# Patient Record
Sex: Female | Born: 1985 | Race: Black or African American | Hispanic: No | Marital: Married | Smoking: Never smoker
Health system: Southern US, Community
[De-identification: ages and names within clinical notes are randomized; demographics above are authoritative.]

## PROBLEM LIST (undated history)

## (undated) HISTORY — PX: CHOLECYSTECTOMY: SHX55

---
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
## 2005-04-20 ENCOUNTER — Emergency Department (HOSPITAL_COMMUNITY): Admission: EM | Admit: 2005-04-20 | Discharge: 2005-04-20 | Payer: Self-pay | Admitting: Emergency Medicine

## 2011-05-11 ENCOUNTER — Emergency Department (HOSPITAL_COMMUNITY)
Admission: EM | Admit: 2011-05-11 | Discharge: 2011-05-12 | Disposition: A | Discharge status: Home / Self Care | Payer: Medicaid Other | Attending: Emergency Medicine | Admitting: Emergency Medicine

## 2011-05-11 ENCOUNTER — Encounter (HOSPITAL_COMMUNITY): Payer: Self-pay | Admitting: Emergency Medicine

## 2011-05-11 DIAGNOSIS — IMO0002 Reserved for concepts with insufficient information to code with codable children: Secondary | ICD-10-CM | POA: Insufficient documentation

## 2011-05-11 DIAGNOSIS — M79609 Pain in unspecified limb: Secondary | ICD-10-CM | POA: Insufficient documentation

## 2011-05-11 DIAGNOSIS — M795 Residual foreign body in soft tissue: Secondary | ICD-10-CM | POA: Insufficient documentation

## 2011-05-11 MED ORDER — LIDOCAINE-EPINEPHRINE 2 %-1:100000 IJ SOLN: 1.7000 mL | Freq: Once | INTRAMUSCULAR | Status: DC

## 2011-05-11 NOTE — ED Notes (Addendum)
Right hand abscess, started last two weeks. Noted about penials eraser head on top of the right forearm,  Stated it hurt

## 2011-05-11 NOTE — ED Notes (Signed)
PT. REPORTS ABSCESS AT RIGHT WRIST WITH DRAINAGE FOR 2 WEEKS.

## 2011-05-11 NOTE — ED Notes (Signed)
Md at bedside

## 2011-05-11 NOTE — Discharge Instructions (Signed)
Foreign Body When the skin is cut or punctured and some object is left in the tissue under the skin, that object is called a "foreign body". A foreign body could be a wood splinter, a thorn, a sliver of metal, a shard of glass, a cactus needle or the tip of a pencil. In most instances, your caregiver will recommend that the foreign body be removed. If it is not removed, infection, abscess formation, an allergic reaction, chronic pain and disability can occur over time. Sometimes, foreign bodies (particularly very small ones) can be difficult to locate. Your caregiver may recommend x-rays or ultrasound imaging to help find them. If removal is not successful, there may be a need to see a surgeon who might suggest further exploration in the operating room. Occasionally, tiny bits of metallic foreign material (such as shrapnel) are not removed, if it is felt that there would be no harm in leaving them untouched. HOME CARE INSTRUCTIONS  Rest the injured area and keep it elevated until all the pain and swelling are gone.   You will need a tetanus vaccination if you have not had one in the last 5 years.   Return to this facility, see your caregiver or follow-up as instructed in 2 days.  SEEK IMMEDIATE MEDICAL CARE IF:   You develop increasing redness or swelling of the skin near the wound.   You develop drainage of pus from the wound.   You have persistent pain or loss of motion.   You have red streaks extending above or below the wound location.  MAKE SURE YOU:   Understand these instructions.   Will watch your condition.   Will get help right away if you are not doing well or get worse.  Document Released: 02/17/2005 Document Revised: 02/06/2011 Document Reviewed: 01/19/2009 [facility] Patient Information 2012 [place], [place]. Please negative on with your primary care provider to have the suture removed in 1 day.  Keep this ear clean and dry.  He can wash once daily with soap and water and apply  a thin layer of antibiotic ointment

## 2011-05-11 NOTE — ED Provider Notes (Signed)
History     CSN: [id number]  Arrival date & time 05/11/11  2226   First MD Initiated Contact with Patient 05/11/11 2302      Chief Complaint  Patient presents with  . Abscess    (Consider location/radiation/quality/duration/timing/severity/associated sxs/prior treatment) HPI Comments: Patient has a dermal [employer] for body piercing in her right forearm that has become displaced and is partially erupted through the skin  Patient is a 26 y.o. female presenting with abscess. The history is provided by the patient.  Abscess  This is a new problem. The current episode started less than one week ago. The problem occurs continuously. Pertinent negatives include no fever.    History reviewed. No pertinent past medical history.  History reviewed. No pertinent past surgical history.  History reviewed. No pertinent family history.  History  Substance Use Topics  . Smoking status: Never Smoker   . Smokeless tobacco: Not on file  . Alcohol Use: No    OB History    Grav Para Term Preterm Abortions TAB SAB Ect Mult Living                  Review of Systems  Constitutional: Negative for fever.  Musculoskeletal: Negative for joint swelling.  Skin: Positive for wound.    Allergies  Citrus  Home Medications   Current Outpatient Rx  Name Route Sig Dispense Refill  . PRENATAL 27-0.8 MG PO TABS Oral Take 1 tablet by mouth daily.      BP 133/85  Pulse 71  Temp(Src) 98 F (36.7 C) (Oral)  Resp 14  SpO2 100%  LMP 04/18/2011  Physical Exam  Constitutional: She appears well-developed and well-nourished.  HENT:  Head: Normocephalic.  Cardiovascular: Normal rate.   Pulmonary/Chest: She is in respiratory distress.  Musculoskeletal: Normal range of motion.  Skin:       Foreign body partially erupted.  The skin on the distal right forearm.  No surrounding erythema.  The object is painful to the surrounding tissue when moved    ED Course  FOREIGN BODY REMOVAL Date/Time:  05/11/2011 11:50 PM Performed by: Arman Filter Authorized by: Arman Filter Consent: Verbal consent obtained. Risks and benefits: risks, benefits and alternatives were discussed Consent given by: patient Patient identity confirmed: verbally with patient Time out: Immediately prior to procedure a "time out" was called to verify the correct patient, procedure, equipment, support staff and site/side marked as required. Body area: skin Anesthesia: local infiltration Local anesthetic: lidocaine 1% with epinephrine Anesthetic total: 1 ml Patient sedated: no Patient restrained: no Localization method: visualized Removal mechanism: scalpel Dressing: antibiotic ointment and dressing applied Tendon involvement: none Complexity: simple Objects recovered: dermal Post-procedure assessment: foreign body removed Patient tolerance: Patient tolerated the procedure well with no immediate complications. LACERATION REPAIR Performed by: Arman Filter Authorized by: Arman Filter   (including critical care time)  Labs Reviewed - No data to display No results found.   1. Foreign body (FB) in soft tissue     LACERATION REPAIR Performed by: Arman Filter Authorized by: Arman Filter Consent: Verbal consent obtained. Risks and benefits: risks, benefits and alternatives were discussed Consent given by: patient Patient identity confirmed: provided demographic data Prepped and Draped in normal sterile fashion Wound explored  Laceration Location: incision for FB removal   Laceration Length: .25cm  No Foreign Bodies seen or palpated  Anesthesia: local infiltration  Local anesthetic: lidocaine1% epinephrine  Anesthetic total: 1ml  Irrigation method: syringe Amount of cleaning: standard  Skin closure: 4- o prolene  Number of sutures: 1  Technique: simple  Patient tolerance: Patient tolerated the procedure well with no immediate complications.  MDM          Arman Filter, NP 05/11/11 2355

## 2011-05-12 NOTE — ED Provider Notes (Signed)
Medical screening examination/treatment/procedure(s) were performed by non-physician practitioner and as supervising physician I was immediately available for consultation/collaboration.  Doug Sou, MD 05/12/11 [phone]

## 2011-05-12 NOTE — ED Notes (Signed)
Triple ABT applied site and covered with bandage. Dc instructions and home care of site given to pt. Voiced understanding.

## 2011-07-29 ENCOUNTER — Emergency Department (HOSPITAL_COMMUNITY)
Admission: EM | Admit: 2011-07-29 | Discharge: 2011-07-29 | Disposition: A | Discharge status: Home Health | Payer: Medicaid Other | Attending: Emergency Medicine | Admitting: Emergency Medicine

## 2011-07-29 ENCOUNTER — Encounter (HOSPITAL_COMMUNITY): Payer: Self-pay | Admitting: Emergency Medicine

## 2011-07-29 DIAGNOSIS — A6000 Herpesviral infection of urogenital system, unspecified: Secondary | ICD-10-CM | POA: Insufficient documentation

## 2011-07-29 LAB — URINALYSIS, ROUTINE W REFLEX MICROSCOPIC
Bilirubin Urine: NEGATIVE
Glucose, UA: NEGATIVE mg/dL
Ketones, ur: NEGATIVE mg/dL
Leukocytes, UA: NEGATIVE
Nitrite: NEGATIVE
Protein, ur: NEGATIVE mg/dL
Specific Gravity, Urine: 1.031 — ABNORMAL HIGH (ref 1.005–1.030)
Urobilinogen, UA: 0.2 mg/dL (ref 0.0–1.0)
pH: 5.5 (ref 5.0–8.0)

## 2011-07-29 LAB — URINE MICROSCOPIC-ADD ON

## 2011-07-29 LAB — POCT PREGNANCY, URINE: Preg Test, Ur: NEGATIVE

## 2011-07-29 MED ORDER — ACYCLOVIR 200 MG PO CAPS
400.0000 mg | ORAL_CAPSULE | Freq: Once | ORAL | Status: AC
  Administered 2011-07-29: 400 mg via ORAL
  Filled 2011-07-29: qty 2

## 2011-07-29 MED ORDER — LIDOCAINE 5 % EX OINT
TOPICAL_OINTMENT | Freq: Once | CUTANEOUS | Status: AC
  Administered 2011-07-29: 23:00:00 via TOPICAL
  Filled 2011-07-29: qty 35.44

## 2011-07-29 MED ORDER — ACYCLOVIR 200 MG PO CAPS: 400.0000 mg | ORAL_CAPSULE | Freq: Every day | ORAL | Status: AC

## 2011-07-29 MED ORDER — LIDOCAINE HCL 2 % EX GEL: Freq: Once | CUTANEOUS | Status: DC

## 2011-07-29 MED ORDER — ACYCLOVIR 200 MG PO CAPS: 400.0000 mg | ORAL_CAPSULE | Freq: Every day | ORAL | Status: DC

## 2011-07-29 NOTE — ED Provider Notes (Signed)
History     CSN: [id number]  Arrival date & time 07/29/11  1644   None     Chief Complaint  Patient presents with  . Vaginal Discharge    (Consider location/radiation/quality/duration/timing/severity/associated sxs/prior treatment) HPI Comments: Yesterday patient noticed a burning sensation on the right side of her labia.  Today, burning became worse, and she left, and she has small vesicles in the area about a half a centimeter that are now tender to touch.   Patient is a 26 y.o. female presenting with vaginal discharge. The history is provided by the patient.  Vaginal Discharge This is a new problem. The current episode started yesterday. The problem occurs constantly. The problem has been gradually worsening. Associated symptoms include a rash. Pertinent negatives include no fever.    History reviewed. No pertinent past medical history.  History reviewed. No pertinent past surgical history.  No family history on file.  History  Substance Use Topics  . Smoking status: Never Smoker   . Smokeless tobacco: Not on file  . Alcohol Use: No    OB History    Grav Para Term Preterm Abortions TAB SAB Ect Mult Living                  Review of Systems  Constitutional: Negative for fever and activity change.  Genitourinary: Positive for vaginal discharge. Negative for dysuria, vaginal bleeding and vaginal pain.  Skin: Positive for rash.  Neurological: Negative for dizziness.    Allergies  Citrus  Home Medications   Current Outpatient Rx  Name Route Sig Dispense Refill  . ACYCLOVIR 200 MG PO CAPS Oral Take 2 capsules (400 mg total) by mouth 5 (five) times daily. 25 capsule 0    BP 137/93  Pulse 97  Temp(Src) 98.1 F (36.7 C) (Oral)  Resp 16  SpO2 98%  Physical Exam  Constitutional: She appears well-developed and well-nourished.  HENT:  Head: Normocephalic.  Eyes: Pupils are equal, round, and reactive to light.  Cardiovascular: Normal rate.   Genitourinary:      There is tenderness and lesion on the right labia.  Musculoskeletal: Normal range of motion.  Neurological: She is alert.  Skin: Skin is warm. Rash noted.    ED Course  Procedures (including critical care time)  Labs Reviewed  URINALYSIS, ROUTINE W REFLEX MICROSCOPIC - Abnormal; Notable for the following:    Specific Gravity, Urine 1.031 (*)    Hgb urine dipstick TRACE (*)    All other components within normal limits  URINE MICROSCOPIC-ADD ON - Abnormal; Notable for the following:    Bacteria, UA FEW (*)    All other components within normal limits  POCT PREGNANCY, URINE  HERPES SIMPLEX VIRUS CULTURE   No results found.   1. Genital herpes       MDM   The lesions appear to be herpetic in nature.  I have obtained a herpes culture.  I will start patient on acyclovir as a prophylactic        Arman Filter, NP 07/29/11 2233  Arman Filter, NP 07/29/11 2235

## 2011-07-29 NOTE — ED Notes (Signed)
Pt returned.

## 2011-07-29 NOTE — ED Notes (Signed)
Onset one day ago burning sensation vaginal area. Pain 10/10 burning.

## 2011-07-29 NOTE — Discharge Instructions (Signed)
Genital Herpes Genital herpes is a sexually transmitted disease. This means that it is a disease passed by having sex with an infected person. There is no cure for genital herpes. The time between attacks can be months to years. The virus may live in a person but produce no problems (symptoms). This infection can be passed to a baby as it travels down the birth canal (vagina). In a newborn, this can cause central nervous system damage, eye damage, or even death. The virus that causes genital herpes is usually HSV-2 virus. The virus that causes oral herpes is usually HSV-1. The diagnosis (learning what is wrong) is made through culture results. SYMPTOMS  Usually symptoms of pain and itching begin a few days to a week after contact. It first appears as small blisters that progress to small painful ulcers which then scab over and heal after several days. It affects the outer genitalia, birth canal, cervix, penis, anal area, buttocks, and thighs. HOME CARE INSTRUCTIONS   Keep ulcerated areas dry and clean.   Take medications as directed. Antiviral medications can speed up healing. They will not prevent recurrences or cure this infection. These medications can also be taken for suppression if there are frequent recurrences.   While the infection is active, it is contagious. Avoid all sexual contact during active infections.   Condoms may help prevent spread of the herpes virus.   Practice safe sex.   Wash your hands thoroughly after touching the genital area.   Avoid touching your eyes after touching your genital area.   Inform your caregiver if you have had genital herpes and become pregnant. It is your responsibility to insure a safe outcome for your baby in this pregnancy.   Only take over-the-counter or prescription medicines for pain, discomfort, or fever as directed by your caregiver.  SEEK MEDICAL CARE IF:   You have a recurrence of this infection.   You do not respond to medications and  are not improving.   You have new sources of pain or discharge which have changed from the original infection.   You have an oral temperature above 102 F (38.9 C).   You develop abdominal pain.   You develop eye pain or signs of eye infection.  Document Released: 02/15/2000 Document Revised: 02/06/2011 Document Reviewed: 03/07/2009 [employer] Patient Information 2012 [employer]. 
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
## 2011-07-30 NOTE — ED Notes (Signed)
Prescription clarification for acyclovir/zovirax; chris lawyer states to dispense qs up to 100 tabs for 200mg   2 pills 5x daily for 10 days; [facility] on [address] notified at [phone].

## 2011-07-30 NOTE — ED Provider Notes (Signed)
Medical screening examination/treatment/procedure(s) were performed by non-physician practitioner and as supervising physician I was immediately available for consultation/collaboration.  Geneen Dieter, MD 07/30/11 0942 

## 2011-07-31 LAB — HERPES SIMPLEX VIRUS CULTURE: Culture: DETECTED

## 2011-08-01 NOTE — ED Notes (Addendum)
+  Herpes. Patient treated Acyclovir. Per protocol MD.

## 2011-08-01 NOTE — ED Notes (Signed)
Patient called for test results. Positive for herpes type two. Will f/u with PCP and notify partner

## 2015-07-08 ENCOUNTER — Encounter: Payer: Self-pay | Admitting: *Deleted

## 2015-07-08 ENCOUNTER — Emergency Department: Payer: Self-pay

## 2015-07-08 ENCOUNTER — Emergency Department
Admission: EM | Admit: 2015-07-08 | Discharge: 2015-07-08 | Disposition: A | Discharge status: Home / Self Care | Payer: Self-pay | Attending: Emergency Medicine | Admitting: Emergency Medicine

## 2015-07-08 DIAGNOSIS — M25561 Pain in right knee: Secondary | ICD-10-CM | POA: Insufficient documentation

## 2015-07-08 MED ORDER — HYDROCODONE-ACETAMINOPHEN 5-325 MG PO TABS
1.0000 | ORAL_TABLET | Freq: Once | ORAL | Status: AC
Start: 2015-07-08 — End: 2015-07-08
  Administered 2015-07-08: 1 via ORAL

## 2015-07-08 MED ORDER — HYDROCODONE-ACETAMINOPHEN 5-325 MG PO TABS: 1.0000 | ORAL_TABLET | ORAL | Status: DC | PRN

## 2015-07-08 MED ORDER — HYDROCODONE-ACETAMINOPHEN 5-325 MG PO TABS
ORAL_TABLET | ORAL | Status: AC
  Filled 2015-07-08: qty 1

## 2015-07-08 NOTE — Discharge Instructions (Signed)
Cryotherapy Cryotherapy is when you put ice on your injury. Ice helps lessen pain and puffiness (swelling) after an injury. Ice works the best when you start using it in the first 24 to 48 hours after an injury. HOME CARE  Put a dry or damp towel between the ice pack and your skin.  You may press gently on the ice pack.  Leave the ice on for no more than 10 to 20 minutes at a time.  Check your skin after 5 minutes to make sure your skin is okay.  Rest at least 20 minutes between ice pack uses.  Stop using ice when your skin loses feeling (numbness).  Do not use ice on someone who cannot tell you when it hurts. This includes small children and people with memory problems (dementia). GET HELP RIGHT AWAY IF:  You have white spots on your skin.  Your skin turns blue or pale.  Your skin feels waxy or hard.  Your puffiness gets worse. MAKE SURE YOU:   Understand these instructions.  Will watch your condition.  Will get help right away if you are not doing well or get worse.   This information is not intended to replace advice given to you by your health care provider. Make sure you discuss any questions you have with your health care provider.   Document Released: 08/06/2007 Document Revised: 05/12/2011 Document Reviewed: 10/10/2010 Else[employer] Interactive Patient Education [employer]   Follow-up with Dr. Rosita KeaMenz if any continued problems with her knee. Ice and elevate as needed for swelling and pain. Continue taking ibuprofen as needed for inflammation and pain. Norco as needed for pain. Beware that he cannot drive or operate machinery while taking a narcotic.

## 2015-07-08 NOTE — ED Notes (Signed)
States was at a trampoline park yesterday and felt her right knee buckle

## 2015-07-08 NOTE — ED Notes (Signed)
Pt verbalizes understanding she is not to drive for 8 hours after administration of norco.

## 2015-07-08 NOTE — ED Provider Notes (Signed)
[facility] Emergency Department Provider Note  ___________________________________________  Time seen: Approximately 6:27 PM  I have reviewed the triage vital signs and the nursing notes.   HISTORY  Chief Complaint Knee Pain   HPI Tammy Phillips is a 30 y.o. female is here with complaint of right knee pain. Patient states she was at a trampoline park yesterday and felt her knee buckle. She is continued to walk on it but has gradually continued to be painful. She's been taking ibuprofen at home with some minimal relief. She denies any previous problems with her knee. She denies any previous problems with her knee. Patient is continue to ambulate and also drove herself to the emergency room.Currently she rates her pain a 5 out of 10.   History reviewed. No pertinent past medical history.  There are no active problems to display for this patient.   History reviewed. No pertinent past surgical history.  Current Outpatient Rx  Name  Route  Sig  Dispense  Refill  . HYDROcodone-acetaminophen (NORCO/VICODIN) 5-325 MG tablet   Oral   Take 1 tablet by mouth every 4 (four) hours as needed for moderate pain.   20 tablet   0     Allergies Citrus  History reviewed. No pertinent family history.  Social History Social History  Substance Use Topics  . Smoking status: Never Smoker   . Smokeless tobacco: None  . Alcohol Use: No    Review of Systems Constitutional: No fever/chills Cardiovascular: Denies chest pain. Respiratory: Denies shortness of breath. Gastrointestinal: .  No nausea, no vomiting.   Musculoskeletal:Positive right knee pain. Skin: Negative for rash. Neurological: Negative for headaches, focal weakness or numbness.  10-point ROS otherwise negative.  ____________________________________________   PHYSICAL EXAM:  VITAL SIGNS: ED Triage Vitals  Enc Vitals Group     BP 07/08/15 1740 110/75 mmHg     Pulse Rate 07/08/15 1740 93   Resp 07/08/15 1740 18     Temp 07/08/15 1740 97.8 F (36.6 C)     Temp Source 07/08/15 1740 Oral     SpO2 07/08/15 1740 100 %     Weight 07/08/15 1740 220 lb (99.791 kg)     Height 07/08/15 1740  (1.676 m)     Head Cir --      Peak Flow --      Pain Score 07/08/15 1750 5     Pain Loc --      Pain Edu? --      Excl. in GC? --     Constitutional: Alert and oriented. Well appearing and in no acute distress.Patient is moderately obese. Eyes: Conjunctivae are normal. PERRL. EOMI. Head: Atraumatic. Nose: No congestion/rhinnorhea. Neck: No stridor.   Cardiovascular: Normal rate, regular rhythm. Grossly normal heart sounds.  Good peripheral circulation. Respiratory: Normal respiratory effort.  No retractions. Lungs CTAB. Musculoskeletal: Right anterior knee no gross deformity is noted. There is no effusion appreciated. There is no crepitus on range of motion. Patient is able to weight-bear. Neurologic:  Normal speech and language. No gross focal neurologic deficits are appreciated. No gait instability. Skin:  Skin is warm, dry and intact. No abrasion, ecchymosis or erythema is noted. Psychiatric: Mood and affect are normal. Speech and behavior are normal.  ____________________________________________   LABS (all labs ordered are listed, but only abnormal results are displayed)  Labs Reviewed - No data to display  RADIOLOGY  Right knee x-ray per radiologist shows no fracture dislocation. There is a small joint  effusion. ____________________________________________   PROCEDURES  Procedure(s) performed: None  Critical Care performed: No  ____________________________________________   INITIAL IMPRESSION / ASSESSMENT AND PLAN / ED COURSE  Pertinent labs & imaging results that were available during my care of the patient were reviewed by me and considered in my medical decision making (see chart for details).  Patient was placed in knee immobilizer and given a set  of Crutches. Patient is continue taking ibuprofen and also given a prescription for Norco if needed for severe pain. She is to follow-up with Dr. Rosita KeaMenz if any continued problems with her knee. She is also instructed to ice and elevate as needed for swelling and pain. ____________________________________________   FINAL CLINICAL IMPRESSION(S) / ED DIAGNOSES  Final diagnoses:  Knee pain, acute, right      NEW MEDICATIONS STARTED DURING THIS VISIT:  New Prescriptions   HYDROCODONE-ACETAMINOPHEN (NORCO/VICODIN) 5-325 MG TABLET    Take 1 tablet by mouth every 4 (four) hours as needed for moderate pain.     Note:  This document was prepared using Dragon voice recognition software and may include unintentional dictation errors.    Tommi RumpsRhonda L Lissandra Keil, PA-C 07/08/15 1917  Loleta Roseory Forbach, MD 07/08/15 2104

## 2015-08-19 ENCOUNTER — Emergency Department (HOSPITAL_COMMUNITY)
Admission: EM | Admit: 2015-08-19 | Discharge: 2015-08-19 | Discharge status: Against Medical Advice | Payer: Medicaid Other
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
## 2018-02-12 ENCOUNTER — Emergency Department (HOSPITAL_COMMUNITY): Payer: Self-pay

## 2018-02-12 ENCOUNTER — Emergency Department (HOSPITAL_COMMUNITY)
Admission: EM | Admit: 2018-02-12 | Discharge: 2018-02-12 | Disposition: A | Discharge status: Home / Self Care | Payer: Self-pay | Attending: Emergency Medicine | Admitting: Emergency Medicine

## 2018-02-12 ENCOUNTER — Other Ambulatory Visit: Payer: Self-pay

## 2018-02-12 DIAGNOSIS — R102 Pelvic and perineal pain: Secondary | ICD-10-CM | POA: Insufficient documentation

## 2018-02-12 LAB — WET PREP, GENITAL
Clue Cells Wet Prep HPF POC: NONE SEEN
SPERM: NONE SEEN
Trich, Wet Prep: NONE SEEN
Yeast Wet Prep HPF POC: NONE SEEN

## 2018-02-12 LAB — COMPREHENSIVE METABOLIC PANEL
ALT: 12 U/L (ref 0–44)
AST: 20 U/L (ref 15–41)
Albumin: 4.3 g/dL (ref 3.5–5.0)
Alkaline Phosphatase: 69 U/L (ref 38–126)
Anion gap: 14 (ref 5–15)
BUN: 10 mg/dL (ref 6–20)
CO2: 21 mmol/L — ABNORMAL LOW (ref 22–32)
Calcium: 9.6 mg/dL (ref 8.9–10.3)
Chloride: 105 mmol/L (ref 98–111)
Creatinine, Ser: 1.03 mg/dL — ABNORMAL HIGH (ref 0.44–1.00)
GFR calc Af Amer: >60 mL/min (ref >60)
GFR calc non Af Amer: >60 mL/min (ref >60)
Glucose, Bld: 98 mg/dL (ref 70–99)
Potassium: 3.6 mmol/L (ref 3.5–5.1)
Sodium: 140 mmol/L (ref 135–145)
Total Bilirubin: 0.6 mg/dL (ref 0.3–1.2)
Total Protein: 7.6 g/dL (ref 6.5–8.1)

## 2018-02-12 LAB — URINALYSIS, ROUTINE W REFLEX MICROSCOPIC
Bilirubin Urine: NEGATIVE
Glucose, UA: NEGATIVE mg/dL
Hgb urine dipstick: NEGATIVE
Ketones, ur: NEGATIVE mg/dL
Leukocytes, UA: NEGATIVE
Nitrite: NEGATIVE
Protein, ur: NEGATIVE mg/dL
Specific Gravity, Urine: >1.046 — ABNORMAL HIGH (ref 1.005–1.030)
pH: 5 (ref 5.0–8.0)

## 2018-02-12 LAB — CBC
HCT: 45.3 % (ref 36.0–46.0)
Hemoglobin: 14.4 g/dL (ref 12.0–15.0)
MCH: 29.6 pg (ref 26.0–34.0)
MCHC: 31.8 g/dL (ref 30.0–36.0)
MCV: 93.2 fL (ref 80.0–100.0)
Platelets: 241 10*3/uL (ref 150–400)
RBC: 4.86 MIL/uL (ref 3.87–5.11)
RDW: 12.5 % (ref 11.5–15.5)
WBC: 11.6 10*3/uL — ABNORMAL HIGH (ref 4.0–10.5)
nRBC: 0 % (ref 0.0–0.2)

## 2018-02-12 LAB — LIPASE, BLOOD: Lipase: 26 U/L (ref 11–51)

## 2018-02-12 LAB — I-STAT BETA HCG BLOOD, ED (MC, WL, AP ONLY): I-stat hCG, quantitative: <5 m[IU]/mL (ref <5)

## 2018-02-12 LAB — GC/CHLAMYDIA PROBE AMP (~~LOC~~) NOT AT ARMC
Chlamydia: NEGATIVE
Neisseria Gonorrhea: NEGATIVE

## 2018-02-12 MED ORDER — SODIUM CHLORIDE 0.9 % IV BOLUS
1000.0000 mL | Freq: Once | INTRAVENOUS | Status: AC
  Administered 2018-02-12: 1000 mL via INTRAVENOUS

## 2018-02-12 MED ORDER — IBUPROFEN 800 MG PO TABS: 800.0000 mg | ORAL_TABLET | Freq: Four times a day (QID) | ORAL | 0 refills | Status: AC | PRN

## 2018-02-12 MED ORDER — IOHEXOL 300 MG/ML  SOLN
100.0000 mL | Freq: Once | INTRAMUSCULAR | Status: AC | PRN
  Administered 2018-02-12: 100 mL via INTRAVENOUS

## 2018-02-12 MED ORDER — KETOROLAC TROMETHAMINE 30 MG/ML IJ SOLN
15.0000 mg | Freq: Once | INTRAMUSCULAR | Status: AC
  Administered 2018-02-12: 15 mg via INTRAVENOUS
  Filled 2018-02-12: qty 1

## 2018-02-12 MED ORDER — HYDROCODONE-ACETAMINOPHEN 5-325 MG PO TABS: 1.0000 | ORAL_TABLET | ORAL | 0 refills | Status: AC | PRN

## 2018-02-12 MED ORDER — ONDANSETRON HCL 4 MG/2ML IJ SOLN
4.0000 mg | Freq: Once | INTRAMUSCULAR | Status: AC | PRN
  Administered 2018-02-12: 4 mg via INTRAVENOUS
  Filled 2018-02-12: qty 2

## 2018-02-12 MED ORDER — METOCLOPRAMIDE HCL 5 MG/ML IJ SOLN
10.0000 mg | Freq: Once | INTRAMUSCULAR | Status: DC
  Filled 2018-02-12: qty 2

## 2018-02-12 NOTE — ED Triage Notes (Addendum)
Patient c/o abdominal pain that began Monday. Also states that took a pregnancy test yesterday and it was positive.

## 2018-02-12 NOTE — ED Notes (Signed)
Urine requested from patient

## 2018-02-12 NOTE — ED Notes (Signed)
Patient is actively vomiting/dry heaving.

## 2018-02-12 NOTE — ED Notes (Signed)
Patient verbalizes understanding of discharge instructions. Opportunity for questioning and answers were provided. Pt discharged from ED. 

## 2018-02-12 NOTE — ED Provider Notes (Signed)
[facility] EMERGENCY DEPARTMENT Provider Note   CSN: [id number] Arrival date & time: 02/12/18  0140     History   Chief Complaint Chief Complaint  Patient presents with  . Abdominal Pain    HPI Tammy Phillips is a 32 y.o. female.  Patient presents to emergency department for evaluation of lower abdominal pain.  Patient reports that pain has been present for 4 days.  She has not had nausea, vomiting, diarrhea or constipation.  Patient denies fever.  Patient states that she had her last menstrual period 1 month ago, but it was very late, only last 2 days which was unusual.  She took a home pregnancy test yesterday and it was positive.  She has not had any vaginal bleeding or discharge.     No past medical history on file.  There are no active problems to display for this patient.   No past surgical history on file.   OB History   No obstetric history on file.      Home Medications    Prior to Admission medications   Medication Sig Start Date End Date Taking? Authorizing Provider  HYDROcodone-acetaminophen (NORCO/VICODIN) 5-325 MG tablet Take 1-2 tablets by mouth every 4 (four) hours as needed for moderate pain. 02/12/18   Gilda Crease, MD  ibuprofen (ADVIL,MOTRIN) 800 MG tablet Take 1 tablet (800 mg total) by mouth every 6 (six) hours as needed for moderate pain. 02/12/18   Gilda Crease, MD    Family History No family history on file.  Social History Social History   Tobacco Use  . Smoking status: Never Smoker  Substance Use Topics  . Alcohol use: No  . Drug use: No     Allergies   Citrus   Review of Systems Review of Systems  Gastrointestinal: Positive for abdominal pain.  All other systems reviewed and are negative.    Physical Exam Updated Vital Signs BP (!) 96/58   Pulse 75   Temp 98.5 F (36.9 C) (Oral)   Resp 18   Ht 5\' 6"  (1.676 m)   Wt 101.2 kg   SpO2 100%   BMI 35.99 kg/m   Physical  Exam Vitals signs and nursing note reviewed.  Constitutional:      General: She is not in acute distress.    Appearance: Normal appearance. She is well-developed.  HENT:     Head: Normocephalic and atraumatic.     Right Ear: Hearing normal.     Left Ear: Hearing normal.     Nose: Nose normal.  Eyes:     Conjunctiva/sclera: Conjunctivae normal.     Pupils: Pupils are equal, round, and reactive to light.  Neck:     Musculoskeletal: Normal range of motion and neck supple.  Cardiovascular:     Rate and Rhythm: Regular rhythm.     Heart sounds: S1 normal and S2 normal. No murmur. No friction rub. No gallop.   Pulmonary:     Effort: Pulmonary effort is normal. No respiratory distress.     Breath sounds: Normal breath sounds.  Chest:     Chest wall: No tenderness.  Abdominal:     General: Bowel sounds are normal.     Palpations: Abdomen is soft.     Tenderness: There is abdominal tenderness in the suprapubic area. There is no guarding or rebound. Negative signs include Murphy's sign and McBurney's sign.     Hernia: No hernia is present.  Genitourinary:    Vagina: Normal.  Cervix: Normal.     Uterus: Normal.      Adnexa:        Right: Tenderness present. No mass.         Left: Tenderness present. No mass.    Musculoskeletal: Normal range of motion.  Skin:    General: Skin is warm and dry.     Findings: No rash.  Neurological:     Mental Status: She is alert and oriented to person, place, and time.     GCS: GCS eye subscore is 4. GCS verbal subscore is 5. GCS motor subscore is 6.     Cranial Nerves: No cranial nerve deficit.     Sensory: No sensory deficit.     Coordination: Coordination normal.  Psychiatric:        Speech: Speech normal.        Behavior: Behavior normal.        Thought Content: Thought content normal.      ED Treatments / Results  Labs (all labs ordered are listed, but only abnormal results are displayed) Labs Reviewed  WET PREP, GENITAL -  Abnormal; Notable for the following components:      Result Value   WBC, Wet Prep HPF POC FEW (*)    All other components within normal limits  COMPREHENSIVE METABOLIC PANEL - Abnormal; Notable for the following components:   CO2 21 (*)    Creatinine, Ser 1.03 (*)    All other components within normal limits  CBC - Abnormal; Notable for the following components:   WBC 11.6 (*)    All other components within normal limits  URINALYSIS, ROUTINE W REFLEX MICROSCOPIC - Abnormal; Notable for the following components:   Specific Gravity, Urine >1.046 (*)    All other components within normal limits  LIPASE, BLOOD  I-STAT BETA HCG BLOOD, ED ([facility], [facility], [facility] ONLY)  GC/CHLAMYDIA PROBE AMP ([place]) NOT AT [facility]    EKG None  Radiology Ct Abdomen Pelvis W Contrast  Result Date: 02/12/2018 CLINICAL DATA:  32 year old female with abdominal pain. EXAM: CT ABDOMEN AND PELVIS WITH CONTRAST TECHNIQUE: Multidetector CT imaging of the abdomen and pelvis was performed using the standard protocol following bolus administration of intravenous contrast. CONTRAST:  OMNIPAQUE IOHEXOL 300 MG/ML  SOLN COMPARISON:  None. FINDINGS: Lower chest: The visualized lung bases are clear. No intra-abdominal free air. Trace free fluid within pelvis, likely physiologic. Hepatobiliary: Probable mild fatty infiltration of the liver. No intrahepatic biliary ductal dilatation. Cholecystectomy. Pancreas: Unremarkable. No pancreatic ductal dilatation or surrounding inflammatory changes. Spleen: Normal in size without focal abnormality. Adrenals/Urinary Tract: The adrenal glands are unremarkable. The kidneys, visualized ureters, and urinary bladder appear unremarkable. Stomach/Bowel: There is no bowel obstruction or active inflammation. Normal appendix. Vascular/Lymphatic: No significant vascular findings are present. No enlarged abdominal or pelvic lymph nodes. Reproductive: The uterus is anteverted and grossly unremarkable.  Slightly prominent ovaries with multiple follicles, likely physiologic or polycystic morphology. Ultrasound may provide better evaluation. Other: Small fat containing supraumbilical hernia. No associated inflammatory changes or fluid collection. Musculoskeletal: There is degenerative changes of the spine at L5-S1. No acute osseous pathology. IMPRESSION: 1. No acute intra-abdominal or pelvic pathology. No bowel obstruction or active inflammation. Normal appendix. 2. Slightly prominent ovaries with multiple follicles, likely physiologic or polycystic morphology. Ultrasound may provide better evaluation if clinically indicated. Electronically Signed   By: Elgie Collard M.D.   On: 02/12/2018 05:45    Procedures Procedures (including critical care time)  Medications Ordered in  ED Medications  ondansetron (ZOFRAN) injection 4 mg (4 mg Intravenous Given 02/12/18 0200)  sodium chloride 0.9 % bolus 1,000 mL (0 mLs Intravenous Stopped 02/12/18 0350)  ketorolac (TORADOL) 30 MG/ML injection 15 mg (15 mg Intravenous Given 02/12/18 0401)  iohexol (OMNIPAQUE) 300 MG/ML solution 100 mL (100 mLs Intravenous Contrast Given 02/12/18 0451)     Initial Impression / Assessment and Plan / ED Course  I have reviewed the triage vital signs and the nursing notes.  Pertinent labs & imaging results that were available during my care of the patient were reviewed by me and considered in my medical decision making (see chart for details).     Patient presents to the emergency department for evaluation of lower abdominal pain.  Symptoms ongoing for several days.  Patient's examination revealed diffuse lower abdominal tenderness.  Pelvic exam revealed mild tenderness in the area of both adnexa, but no masses palpable.  Vital signs unremarkable.  Lab work unremarkable.  Patient underwent CT scan.  No significant abnormalities noted.  No intra-abdominal pathology.  Patient does have small cysts on the ovaries, but nothing  that looks acute or pathologic.  Patient's pain significantly improved after Toradol 50 mg IV.  Will treat with analgesia, follow-up OB/GYN.  Final Clinical Impressions(s) / ED Diagnoses   Final diagnoses:  Pelvic pain in female    ED Discharge Orders         Ordered    HYDROcodone-acetaminophen (NORCO/VICODIN) 5-325 MG tablet  Every 4 hours PRN     02/12/18 0733    ibuprofen (ADVIL,MOTRIN) 800 MG tablet  Every 6 hours PRN     02/12/18 0733           Gilda CreasePollina, Tex Conroy J, MD 02/12/18 [phone]

## 2018-02-22 ENCOUNTER — Emergency Department (HOSPITAL_COMMUNITY)
Admission: EM | Admit: 2018-02-22 | Discharge: 2018-02-22 | Disposition: A | Discharge status: Home / Self Care | Payer: Medicaid Other | Attending: Emergency Medicine | Admitting: Emergency Medicine

## 2018-02-22 ENCOUNTER — Other Ambulatory Visit: Payer: Self-pay

## 2018-02-22 ENCOUNTER — Encounter (HOSPITAL_COMMUNITY): Payer: Self-pay | Admitting: Emergency Medicine

## 2018-02-22 DIAGNOSIS — Z79899 Other long term (current) drug therapy: Secondary | ICD-10-CM | POA: Insufficient documentation

## 2018-02-22 DIAGNOSIS — R1013 Epigastric pain: Secondary | ICD-10-CM | POA: Insufficient documentation

## 2018-02-22 DIAGNOSIS — R1010 Upper abdominal pain, unspecified: Secondary | ICD-10-CM

## 2018-02-22 LAB — CBC WITH DIFFERENTIAL/PLATELET
Abs Immature Granulocytes: 0.03 10*3/uL (ref 0.00–0.07)
BASOS ABS: 0 10*3/uL (ref 0.0–0.1)
Basophils Relative: 0 %
Eosinophils Absolute: 0.1 10*3/uL (ref 0.0–0.5)
Eosinophils Relative: 1 %
HCT: 40.3 % (ref 36.0–46.0)
Hemoglobin: 13.2 g/dL (ref 12.0–15.0)
Immature Granulocytes: 0 %
Lymphocytes Relative: 29 %
Lymphs Abs: 3.1 10*3/uL (ref 0.7–4.0)
MCH: 30.3 pg (ref 26.0–34.0)
MCHC: 32.8 g/dL (ref 30.0–36.0)
MCV: 92.4 fL (ref 80.0–100.0)
Monocytes Absolute: 0.7 10*3/uL (ref 0.1–1.0)
Monocytes Relative: 7 %
NEUTROS ABS: 6.9 10*3/uL (ref 1.7–7.7)
NEUTROS PCT: 63 %
Platelets: 241 10*3/uL (ref 150–400)
RBC: 4.36 MIL/uL (ref 3.87–5.11)
RDW: 12.7 % (ref 11.5–15.5)
WBC: 10.8 10*3/uL — ABNORMAL HIGH (ref 4.0–10.5)
nRBC: 0 % (ref 0.0–0.2)

## 2018-02-22 LAB — COMPREHENSIVE METABOLIC PANEL
ALT: 14 U/L (ref 0–44)
AST: 19 U/L (ref 15–41)
Albumin: 4 g/dL (ref 3.5–5.0)
Alkaline Phosphatase: 63 U/L (ref 38–126)
Anion gap: 8 (ref 5–15)
BUN: 17 mg/dL (ref 6–20)
CO2: 23 mmol/L (ref 22–32)
Calcium: 9.3 mg/dL (ref 8.9–10.3)
Chloride: 106 mmol/L (ref 98–111)
Creatinine, Ser: 1.19 mg/dL — ABNORMAL HIGH (ref 0.44–1.00)
GFR calc Af Amer: >60 mL/min (ref >60)
GFR calc non Af Amer: >60 mL/min (ref >60)
Glucose, Bld: 93 mg/dL (ref 70–99)
Potassium: 3.9 mmol/L (ref 3.5–5.1)
Sodium: 137 mmol/L (ref 135–145)
Total Bilirubin: 0.5 mg/dL (ref 0.3–1.2)
Total Protein: 6.6 g/dL (ref 6.5–8.1)

## 2018-02-22 LAB — LIPASE, BLOOD: Lipase: 26 U/L (ref 11–51)

## 2018-02-22 LAB — I-STAT BETA HCG BLOOD, ED (MC, WL, AP ONLY): I-stat hCG, quantitative: 7 m[IU]/mL — ABNORMAL HIGH (ref <5)

## 2018-02-22 MED ORDER — METOCLOPRAMIDE HCL 5 MG/ML IJ SOLN
10.0000 mg | INTRAMUSCULAR | Status: AC
  Administered 2018-02-22: 10 mg via INTRAVENOUS
  Filled 2018-02-22: qty 2

## 2018-02-22 MED ORDER — SODIUM CHLORIDE 0.9 % IV BOLUS
1000.0000 mL | Freq: Once | INTRAVENOUS | Status: AC
  Administered 2018-02-22: 1000 mL via INTRAVENOUS

## 2018-02-22 MED ORDER — KETOROLAC TROMETHAMINE 30 MG/ML IJ SOLN
30.0000 mg | Freq: Once | INTRAMUSCULAR | Status: AC
  Administered 2018-02-22: 30 mg via INTRAVENOUS
  Filled 2018-02-22: qty 1

## 2018-02-22 NOTE — ED Notes (Signed)
EMT asked pt to provide urine sample, pt stating she just wants to go home. Provider aware

## 2018-02-22 NOTE — ED Triage Notes (Signed)
Pt c/o epigastric pain, nonradiating for the past few days. Pain worsens after getting home from work. Endorses NV, denies diarrhea. Hx of gallstones in which she has had removed but still has her gallbladder

## 2018-02-22 NOTE — ED Provider Notes (Signed)
[facility]   CSN: [id number] Arrival date & time: 02/22/18  0309     History   Chief Complaint Chief Complaint  Patient presents with  . Abdominal Pain    HPI Tammy Phillips is a 32 y.o. female.   32 year old female presents to the emergency department for evaluation of epigastric pain.  She states that she has been having pain intermittently which waxes and wanes in severity.  Pain does not radiate.  Partner at bedside notes that it has been present for "some weeks".  She denies taking any medications for her symptoms prior to arrival.  Notes that it does worsen at times after getting home from work.  She has had nausea with her pain as well as sporadic vomiting today.  No diarrhea.  She continues to pass flatus.  Further denies urinary symptoms, vaginal complaints.  She has a history of prior cholecystectomy.  The history is provided by the patient. No language interpreter was used.  Abdominal Pain      No past medical history on file.  There are no active problems to display for this patient.   Past Surgical History:  Procedure Laterality Date  . CHOLECYSTECTOMY       OB History   No obstetric history on file.      Home Medications    Prior to Admission medications   Medication Sig Start Date End Date Taking? Authorizing Provider  HYDROcodone-acetaminophen (NORCO/VICODIN) 5-325 MG tablet Take 1-2 tablets by mouth every 4 (four) hours as needed for moderate pain. 02/12/18   Gilda CreasePollina, Christopher J, MD  ibuprofen (ADVIL,MOTRIN) 800 MG tablet Take 1 tablet (800 mg total) by mouth every 6 (six) hours as needed for moderate pain. 02/12/18   Gilda CreasePollina, Christopher J, MD    Family History No family history on file.  Social History Social History   Tobacco Use  . Smoking status: Never Smoker  Substance Use Topics  . Alcohol use: No  . Drug use: No     Allergies   Citrus   Review of Systems Review of  Systems  Gastrointestinal: Positive for abdominal pain.  Ten systems reviewed and are negative for acute change, except as noted in the HPI.    Physical Exam Updated Vital Signs BP 107/75 (BP Location: Left Arm)   Pulse 85   Temp 98.2 F (36.8 C) (Oral)   Resp 16   LMP 01/27/2018   SpO2 100%   Physical Exam Vitals signs and nursing note reviewed.  Constitutional:      General: She is not in acute distress.    Appearance: She is well-developed. She is not diaphoretic.     Comments: Anxious appearing, mildly tearful. Nontoxic.   HENT:     Head: Normocephalic and atraumatic.     Mouth/Throat:     Mouth: Mucous membranes are moist.  Eyes:     General: No scleral icterus.    Conjunctiva/sclera: Conjunctivae normal.  Neck:     Musculoskeletal: Normal range of motion.  Cardiovascular:     Rate and Rhythm: Normal rate and regular rhythm.     Pulses: Normal pulses.     Comments: Intermittent mild tachycardia Pulmonary:     Effort: Pulmonary effort is normal. No respiratory distress.     Breath sounds: No wheezing, rhonchi or rales.     Comments: Lungs CTAB Abdominal:     Palpations: There is no mass.     Tenderness: There is abdominal tenderness. There  is no rebound.     Comments: Mild epigastric TTP. Abdomen soft, obese. Nondistended without peritoneal signs.  Musculoskeletal: Normal range of motion.  Skin:    General: Skin is warm and dry.     Coloration: Skin is not pale.     Findings: No erythema or rash.  Neurological:     Mental Status: She is alert and oriented to person, place, and time.     Coordination: Coordination normal.     Comments: GCS 15. Moving all extremities spontaneously.  Psychiatric:        Behavior: Behavior normal.      ED Treatments / Results  Labs (all labs ordered are listed, but only abnormal results are displayed) Labs Reviewed  CBC WITH DIFFERENTIAL/PLATELET - Abnormal; Notable for the following components:      Result Value   WBC  10.8 (*)    All other components within normal limits  I-STAT BETA HCG BLOOD, ED ([facility], [facility], [facility] ONLY) - Abnormal; Notable for the following components:   I-stat hCG, quantitative 7.0 (*)    All other components within normal limits  COMPREHENSIVE METABOLIC PANEL  LIPASE, BLOOD    EKG None  Radiology No results found.  Procedures Procedures (including critical care time)  Medications Ordered in ED Medications  sodium chloride 0.9 % bolus 1,000 mL (0 mLs Intravenous Stopped 02/22/18 0439)  ketorolac (TORADOL) 30 MG/ML injection 30 mg (30 mg Intravenous Given 02/22/18 0359)  metoCLOPramide (REGLAN) injection 10 mg (10 mg Intravenous Given 02/22/18 0358)    Initial Impression / Assessment and Plan / ED Course  I have reviewed the triage vital signs and the nursing notes.  Pertinent labs & imaging results that were available during my care of the patient were reviewed by me and considered in my medical decision making (see chart for details).     73:6650 AM 32 year old female with prior history of cholecystectomy presents to the emergency department for evaluation of upper abdominal pain which has been ongoing for the past few weeks.  She has had sporadic nausea and vomiting with one episode earlier today.  She is afebrile, mildly anxious and tearful.  Has no evidence of acute surgical abdomen on exam.  No guarding or peritoneal signs.  Will obtain labs and hydrate with fluids.  Toradol and Reglan ordered for pain and nausea.  4:07 AM Chart reviewed.  Patient with presentation to outside hospital in June of this year for flank pain and lower abdominal pain.  Also was seen 10 days ago for complaints of lower abdominal discomfort.  She had negative CT scans on both occasions.  4:41 AM Called by nurse as patient requesting to leave the emergency department.  To assess the patient and she states that she no longer wishes to stay for the remainder of her work-up.  She will not expand on why  she does not wish to remain in the ED.  She has no complaints of nausea presently.  Has had no vomiting since arrival.    Most of labs are pending.  She has a mild leukocytosis which is improved compared to prior evaluations.  Given chronicity of symptoms, I do not believe discharge is unreasonable.  Will provide referral to gastroenterology given ongoing pain.   Final Clinical Impressions(s) / ED Diagnoses   Final diagnoses:  Pain of upper abdomen    ED Discharge Orders    None       Antony MaduraHumes, Kelsei Defino, PA-C 02/22/18 0445    Geoffery Lyonselo, Douglas,  MD 02/22/18 5621

## 2018-02-22 NOTE — ED Notes (Signed)
Contacted lab for delay in bloodwork; lab saved tubes instead of running labs

## 2018-02-22 NOTE — Discharge Instructions (Signed)
We recommend use of Tylenol or ibuprofen for management of abdominal pain.  Follow-up with a primary care doctor for evaluation of ongoing symptoms.  You may benefit from follow-up with a gastroenterologist given your ongoing pain.

## 2019-05-26 ENCOUNTER — Emergency Department (HOSPITAL_COMMUNITY): Payer: 59

## 2019-05-26 ENCOUNTER — Encounter (HOSPITAL_COMMUNITY): Payer: Self-pay | Admitting: Emergency Medicine

## 2019-05-26 ENCOUNTER — Other Ambulatory Visit: Payer: Self-pay

## 2019-05-26 ENCOUNTER — Emergency Department (HOSPITAL_COMMUNITY)
Admission: EM | Admit: 2019-05-26 | Discharge: 2019-05-26 | Disposition: A | Discharge status: Home / Self Care | Payer: 59 | Attending: Emergency Medicine | Admitting: Emergency Medicine

## 2019-05-26 DIAGNOSIS — R0789 Other chest pain: Secondary | ICD-10-CM | POA: Diagnosis not present

## 2019-05-26 DIAGNOSIS — R202 Paresthesia of skin: Secondary | ICD-10-CM

## 2019-05-26 LAB — CBC
HCT: 41.2 % (ref 36.0–46.0)
Hemoglobin: 13.4 g/dL (ref 12.0–15.0)
MCH: 29.9 pg (ref 26.0–34.0)
MCHC: 32.5 g/dL (ref 30.0–36.0)
MCV: 92 fL (ref 80.0–100.0)
Platelets: 219 10*3/uL (ref 150–400)
RBC: 4.48 MIL/uL (ref 3.87–5.11)
RDW: 12.6 % (ref 11.5–15.5)
WBC: 8.7 10*3/uL (ref 4.0–10.5)
nRBC: 0 % (ref 0.0–0.2)

## 2019-05-26 LAB — BASIC METABOLIC PANEL
Anion gap: 7 (ref 5–15)
BUN: 19 mg/dL (ref 6–20)
CO2: 25 mmol/L (ref 22–32)
Calcium: 8.9 mg/dL (ref 8.9–10.3)
Chloride: 107 mmol/L (ref 98–111)
Creatinine, Ser: 0.98 mg/dL (ref 0.44–1.00)
GFR calc Af Amer: >60 mL/min (ref >60)
GFR calc non Af Amer: >60 mL/min (ref >60)
Glucose, Bld: 94 mg/dL (ref 70–99)
Potassium: 3.7 mmol/L (ref 3.5–5.1)
Sodium: 139 mmol/L (ref 135–145)

## 2019-05-26 LAB — I-STAT BETA HCG BLOOD, ED (NOT ORDERABLE): I-stat hCG, quantitative: <5 m[IU]/mL (ref <5)

## 2019-05-26 LAB — TROPONIN I (HIGH SENSITIVITY): Troponin I (High Sensitivity): <2 ng/L (ref <18)

## 2019-05-26 MED ORDER — SODIUM CHLORIDE 0.9% FLUSH: 3.0000 mL | Freq: Once | INTRAVENOUS | Status: DC

## 2019-05-26 MED ORDER — CYCLOBENZAPRINE HCL 10 MG PO TABS
10.0000 mg | ORAL_TABLET | Freq: Once | ORAL | Status: AC
  Administered 2019-05-26: 10 mg via ORAL
  Filled 2019-05-26: qty 1

## 2019-05-26 MED ORDER — CYCLOBENZAPRINE HCL 10 MG PO TABS: 10.0000 mg | ORAL_TABLET | Freq: Two times a day (BID) | ORAL | 0 refills | Status: DC | PRN

## 2019-05-26 MED ORDER — NABUMETONE 500 MG PO TABS: 500.0000 mg | ORAL_TABLET | Freq: Two times a day (BID) | ORAL | 0 refills | Status: AC

## 2019-05-26 MED ORDER — IBUPROFEN 200 MG PO TABS
600.0000 mg | ORAL_TABLET | Freq: Once | ORAL | Status: AC
  Administered 2019-05-26: 600 mg via ORAL
  Filled 2019-05-26: qty 3

## 2019-05-26 NOTE — ED Provider Notes (Signed)
[facility] DEPT Provider Note   CSN: [id number] Arrival date & time: 05/26/19  0010     History Chief Complaint  Patient presents with  . Chest Pain    Tammy Phillips is a 34 y.o. female.  Patient to ED with pain in her left chest and left arm that started 3 days ago. No SOB, fever or cough. No neck pain. No injury. She reports pain extends to fingers 2-5 and there is numbness of #2, partial numbness of #3. No report of swelling, redness.   The history is provided by the patient. No language interpreter was used.  Chest Pain Associated symptoms: numbness   Associated symptoms: no abdominal pain, no cough, no fever, no nausea, no shortness of breath and no weakness        History reviewed. No pertinent past medical history.  There are no problems to display for this patient.   History reviewed. No pertinent surgical history.   OB History   No obstetric history on file.     History reviewed. No pertinent family history.  Social History   Tobacco Use  . Smoking status: Never Smoker  . Smokeless tobacco: Never Used  Substance Use Topics  . Alcohol use: Never  . Drug use: Not on file    Home Medications Prior to Admission medications   Medication Sig Start Date End Date Taking? Authorizing Provider  ACYCLOVIR PO Take 1 tablet by mouth daily as needed (outbreak).   Yes [provider]    Allergies    Patient has no known allergies.  Review of Systems   Review of Systems  Constitutional: Negative for fever.  Respiratory: Negative for cough and shortness of breath.   Cardiovascular: Positive for chest pain.  Gastrointestinal: Negative for abdominal pain and nausea.  Musculoskeletal:       See HPI.  Neurological: Positive for numbness. Negative for weakness.    Physical Exam Updated Vital Signs BP 110/83 (BP Location: Right Arm)   Pulse 82   Temp 98 F (36.7 C) (Oral)   Resp 14   Ht 5\' 6"  (1.676 m)   Wt 110.2 kg    LMP 05/10/2019   SpO2 100%   BMI 39.22 kg/m   Physical Exam Vitals and nursing note reviewed.  Constitutional:      Appearance: She is well-developed.  HENT:     Head: Normocephalic.  Cardiovascular:     Rate and Rhythm: Normal rate and regular rhythm.     Pulses: Normal pulses.  Pulmonary:     Effort: Pulmonary effort is normal.     Breath sounds: Normal breath sounds. No wheezing, rhonchi or rales.  Chest:     Chest wall: Tenderness (Left chest wall tenderness without swelling or discoloration. ) present.  Abdominal:     General: Bowel sounds are normal.     Palpations: Abdomen is soft.     Tenderness: There is no abdominal tenderness. There is no guarding or rebound.  Musculoskeletal:        General: Normal range of motion.     Cervical back: Normal range of motion and neck supple.     Comments: Tenderness of left volar AC bilaterally. FROM all joints of left arm. No strength deficits. No midline cervical tenderness.   Skin:    General: Skin is warm and dry.     Findings: No rash.  Neurological:     Mental Status: She is alert and oriented to person, place, and time.  Sensory: Sensory deficit (Decreased sensation to 2nd and 3rd left fingers.) present.     ED Results / Procedures / Treatments   Labs (all labs ordered are listed, but only abnormal results are displayed) Labs Reviewed  BASIC METABOLIC PANEL  CBC  I-STAT BETA HCG BLOOD, ED ([facility], [facility], [facility] ONLY)  I-STAT BETA HCG BLOOD, ED (NOT ORDERABLE)  TROPONIN I (HIGH SENSITIVITY)    EKG EKG Interpretation  Date/Time:  Thursday May 26 2019 00:31:31 EDT Ventricular Rate:  76 PR Interval:    QRS Duration: 84 QT Interval:  385 QTC Calculation: 433 R Axis:   67 Text Interpretation: Normal sinus rhythm Confirmed by Nicanor Alcon, April ([id number]) on 05/26/2019 12:53:16 AM   Radiology DG Chest 2 View  Result Date: 05/26/2019 CLINICAL DATA:  Chest pain EXAM: CHEST - 2 VIEW COMPARISON:  None. FINDINGS: The heart  size and mediastinal contours are within normal limits. Both lungs are clear. The visualized skeletal structures are unremarkable. IMPRESSION: No active cardiopulmonary disease. Electronically Signed   By: Jonna Clark M.D.   On: 05/26/2019 00:29    Procedures Procedures (including critical care time)  Medications Ordered in ED Medications  sodium chloride flush (NS) 0.9 % injection 3 mL (has no administration in time range)  ibuprofen (ADVIL) tablet 600 mg (600 mg Oral Given 05/26/19 0123)  cyclobenzaprine (FLEXERIL) tablet 10 mg (10 mg Oral Given 05/26/19 0123)    ED Course  I have reviewed the triage vital signs and the nursing notes.  Pertinent labs & imaging results that were available during my care of the patient were reviewed by me and considered in my medical decision making (see chart for details).    MDM Rules/Calculators/A&P                      Patient to ED with symptoms as outlined in the HPI.  EKG without acute changes. CXR clear. Pain of complaint is easily reproducible suggesting chest wall pain. Left arm involvement. 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   Labs are unremarkable. She is felt appropriate for discharge home. Will refer for nerve conduction studies given sensory deficits of left hand. No acute process identified. Will treat with ibuprofen, flexeril.  Final Clinical Impression(s) / ED Diagnoses Final diagnoses:  None   1. Chest wall pain 2. Left hand paresthesia  Rx / DC Orders ED Discharge Orders    None       Danne Harbor 05/26/19 0138    Palumbo, April, MD 05/26/19 0147

## 2019-05-26 NOTE — Discharge Instructions (Signed)
Take the medications as prescribed for comfort.   Follow up with [facility] for possible nerve conduction study of your left arm. Apply warm compresses to the sore areas.   Return to the emergency department with any new or worsening symptoms. It is recommended that you become established with a primary care provider as well.

## 2019-05-26 NOTE — ED Triage Notes (Signed)
Patient complaining of left chest pain that is radiating down left arm that started 3 days ago. Patient says her fingers on the left side is numb.

## 2020-04-16 ENCOUNTER — Emergency Department (HOSPITAL_COMMUNITY): Payer: 59

## 2020-04-16 ENCOUNTER — Emergency Department (HOSPITAL_COMMUNITY)
Admission: EM | Admit: 2020-04-16 | Discharge: 2020-04-16 | Disposition: A | Discharge status: Home / Self Care | Payer: 59 | Attending: Emergency Medicine | Admitting: Emergency Medicine

## 2020-04-16 ENCOUNTER — Encounter (HOSPITAL_COMMUNITY): Payer: Self-pay | Admitting: Emergency Medicine

## 2020-04-16 DIAGNOSIS — S161XXA Strain of muscle, fascia and tendon at neck level, initial encounter: Secondary | ICD-10-CM

## 2020-04-16 DIAGNOSIS — R0789 Other chest pain: Secondary | ICD-10-CM

## 2020-04-16 DIAGNOSIS — S199XXA Unspecified injury of neck, initial encounter: Secondary | ICD-10-CM | POA: Diagnosis present

## 2020-04-16 DIAGNOSIS — Y92410 Unspecified street and highway as the place of occurrence of the external cause: Secondary | ICD-10-CM | POA: Insufficient documentation

## 2020-04-16 LAB — CBC WITH DIFFERENTIAL/PLATELET
Abs Immature Granulocytes: 0.04 10*3/uL (ref 0.00–0.07)
Basophils Absolute: 0 10*3/uL (ref 0.0–0.1)
Basophils Relative: 0 %
Eosinophils Absolute: 0.1 10*3/uL (ref 0.0–0.5)
Eosinophils Relative: 1 %
HCT: 42.1 % (ref 36.0–46.0)
Hemoglobin: 13.4 g/dL (ref 12.0–15.0)
Immature Granulocytes: 1 %
Lymphocytes Relative: 21 %
Lymphs Abs: 1.3 10*3/uL (ref 0.7–4.0)
MCH: 29.2 pg (ref 26.0–34.0)
MCHC: 31.8 g/dL (ref 30.0–36.0)
MCV: 91.7 fL (ref 80.0–100.0)
Monocytes Absolute: 0.4 10*3/uL (ref 0.1–1.0)
Monocytes Relative: 6 %
Neutro Abs: 4.5 10*3/uL (ref 1.7–7.7)
Neutrophils Relative %: 71 %
Platelets: 243 10*3/uL (ref 150–400)
RBC: 4.59 MIL/uL (ref 3.87–5.11)
RDW: 13.3 % (ref 11.5–15.5)
WBC: 6.3 10*3/uL (ref 4.0–10.5)
nRBC: 0 % (ref 0.0–0.2)

## 2020-04-16 LAB — COMPREHENSIVE METABOLIC PANEL
ALT: 15 U/L (ref 0–44)
AST: 24 U/L (ref 15–41)
Albumin: 3.8 g/dL (ref 3.5–5.0)
Alkaline Phosphatase: 58 U/L (ref 38–126)
Anion gap: 7 (ref 5–15)
BUN: 21 mg/dL — ABNORMAL HIGH (ref 6–20)
CO2: 25 mmol/L (ref 22–32)
Calcium: 8.9 mg/dL (ref 8.9–10.3)
Chloride: 107 mmol/L (ref 98–111)
Creatinine, Ser: 0.88 mg/dL (ref 0.44–1.00)
GFR, Estimated: >60 mL/min (ref >60)
Glucose, Bld: 94 mg/dL (ref 70–99)
Potassium: 3.5 mmol/L (ref 3.5–5.1)
Sodium: 139 mmol/L (ref 135–145)
Total Bilirubin: 1 mg/dL (ref 0.3–1.2)
Total Protein: 6.7 g/dL (ref 6.5–8.1)

## 2020-04-16 LAB — I-STAT BETA HCG BLOOD, ED (MC, WL, AP ONLY): I-stat hCG, quantitative: <5 m[IU]/mL (ref <5)

## 2020-04-16 MED ORDER — NAPROXEN 500 MG PO TABS: 500.0000 mg | ORAL_TABLET | Freq: Two times a day (BID) | ORAL | 0 refills | Status: AC

## 2020-04-16 MED ORDER — IOHEXOL 300 MG/ML  SOLN
75.0000 mL | Freq: Once | INTRAMUSCULAR | Status: AC | PRN
  Administered 2020-04-16: 75 mL via INTRAVENOUS

## 2020-04-16 MED ORDER — ONDANSETRON 8 MG PO TBDP
8.0000 mg | ORAL_TABLET | Freq: Once | ORAL | Status: AC
  Administered 2020-04-16: 8 mg via ORAL
  Filled 2020-04-16: qty 1

## 2020-04-16 MED ORDER — OXYCODONE-ACETAMINOPHEN 5-325 MG PO TABS
1.0000 | ORAL_TABLET | Freq: Once | ORAL | Status: AC
  Administered 2020-04-16: 1 via ORAL
  Filled 2020-04-16: qty 1

## 2020-04-16 MED ORDER — CYCLOBENZAPRINE HCL 10 MG PO TABS: 10.0000 mg | ORAL_TABLET | Freq: Two times a day (BID) | ORAL | 0 refills | Status: AC | PRN
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
## 2020-04-16 NOTE — Discharge Instructions (Signed)
Please read instructions below. Apply ice to your areas of pain for 20 minutes at a time. You can take naproxen every 12 hours with food as needed for pain. You can take flexeril every 12 hours as needed for muscle spasm. Be aware this medication can make you drowsy. Schedule an appointment with your primary care provider to follow up on your visit today. Return to the ER for severely worsening headache, vision changes, if new numbness or weakness in your arms or legs, inability to urinate, inability to hold your bowels, or concerning symptoms.

## 2021-01-02 ENCOUNTER — Other Ambulatory Visit: Payer: Self-pay

## 2021-01-02 ENCOUNTER — Emergency Department (HOSPITAL_COMMUNITY)
Admission: EM | Admit: 2021-01-02 | Discharge: 2021-01-02 | Disposition: A | Discharge status: Home / Self Care | Payer: Medicaid Other | Attending: Emergency Medicine | Admitting: Emergency Medicine

## 2021-01-02 ENCOUNTER — Emergency Department (HOSPITAL_COMMUNITY): Payer: Medicaid Other

## 2021-01-02 ENCOUNTER — Encounter (HOSPITAL_COMMUNITY): Payer: Self-pay

## 2021-01-02 DIAGNOSIS — Z23 Encounter for immunization: Secondary | ICD-10-CM | POA: Insufficient documentation

## 2021-01-02 DIAGNOSIS — W260XXA Contact with knife, initial encounter: Secondary | ICD-10-CM | POA: Insufficient documentation

## 2021-01-02 DIAGNOSIS — S61412A Laceration without foreign body of left hand, initial encounter: Secondary | ICD-10-CM

## 2021-01-02 MED ORDER — LIDOCAINE-EPINEPHRINE-TETRACAINE (LET) TOPICAL GEL
3.0000 mL | Freq: Once | TOPICAL | Status: AC
  Administered 2021-01-02: 3 mL via TOPICAL
  Filled 2021-01-02: qty 3

## 2021-01-02 MED ORDER — CEPHALEXIN 250 MG PO CAPS: 250.0000 mg | ORAL_CAPSULE | Freq: Four times a day (QID) | ORAL | 0 refills | Status: AC

## 2021-01-02 MED ORDER — HYDROCODONE-ACETAMINOPHEN 5-325 MG PO TABS
1.0000 | ORAL_TABLET | Freq: Once | ORAL | Status: AC
  Administered 2021-01-02: 1 via ORAL
  Filled 2021-01-02: qty 1

## 2021-01-02 MED ORDER — TETANUS-DIPHTH-ACELL PERTUSSIS 5-2.5-18.5 LF-MCG/0.5 IM SUSY
0.5000 mL | PREFILLED_SYRINGE | Freq: Once | INTRAMUSCULAR | Status: AC
  Administered 2021-01-02: 0.5 mL via INTRAMUSCULAR
  Filled 2021-01-02: qty 0.5

## 2021-01-02 MED ORDER — LIDOCAINE HCL (PF) 1 % IJ SOLN
5.0000 mL | Freq: Once | INTRAMUSCULAR | Status: DC
  Filled 2021-01-02: qty 30

## 2021-01-02 NOTE — ED Notes (Addendum)
Pt transported to xray 

## 2021-01-02 NOTE — ED Notes (Signed)
Pt reported dizziness after getting her hand cleaned up. She was laid down supine on a stretcher.

## 2021-01-02 NOTE — Discharge Instructions (Signed)
Sutures will need removed in 7 to 10 days.  There is antibiotic prescribed for prevention of infection.  If area does become increasingly red, swollen, painful, or begins draining pus, please return to the emergency department.
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
## 2021-01-08 ENCOUNTER — Encounter (HOSPITAL_BASED_OUTPATIENT_CLINIC_OR_DEPARTMENT_OTHER): Payer: Self-pay

## 2021-01-08 ENCOUNTER — Emergency Department (HOSPITAL_BASED_OUTPATIENT_CLINIC_OR_DEPARTMENT_OTHER)
Admission: EM | Admit: 2021-01-08 | Discharge: 2021-01-08 | Disposition: A | Discharge status: Home / Self Care | Payer: Self-pay | Attending: Emergency Medicine | Admitting: Emergency Medicine

## 2021-01-08 ENCOUNTER — Other Ambulatory Visit: Payer: Self-pay

## 2021-01-08 ENCOUNTER — Emergency Department (HOSPITAL_BASED_OUTPATIENT_CLINIC_OR_DEPARTMENT_OTHER): Payer: Self-pay

## 2021-01-08 DIAGNOSIS — K429 Umbilical hernia without obstruction or gangrene: Secondary | ICD-10-CM | POA: Insufficient documentation

## 2021-01-08 DIAGNOSIS — D72829 Elevated white blood cell count, unspecified: Secondary | ICD-10-CM | POA: Insufficient documentation

## 2021-01-08 LAB — CBC WITH DIFFERENTIAL/PLATELET
Abs Immature Granulocytes: 0.03 10*3/uL (ref 0.00–0.07)
Basophils Absolute: 0 10*3/uL (ref 0.0–0.1)
Basophils Relative: 0 %
Eosinophils Absolute: 0.1 10*3/uL (ref 0.0–0.5)
Eosinophils Relative: 1 %
HCT: 40.9 % (ref 36.0–46.0)
Hemoglobin: 13.3 g/dL (ref 12.0–15.0)
Immature Granulocytes: 0 %
Lymphocytes Relative: 23 %
Lymphs Abs: 2.2 10*3/uL (ref 0.7–4.0)
MCH: 29.5 pg (ref 26.0–34.0)
MCHC: 32.5 g/dL (ref 30.0–36.0)
MCV: 90.7 fL (ref 80.0–100.0)
Monocytes Absolute: 0.6 10*3/uL (ref 0.1–1.0)
Monocytes Relative: 6 %
Neutro Abs: 6.9 10*3/uL (ref 1.7–7.7)
Neutrophils Relative %: 70 %
Platelets: 232 10*3/uL (ref 150–400)
RBC: 4.51 MIL/uL (ref 3.87–5.11)
RDW: 13.2 % (ref 11.5–15.5)
WBC: 9.9 10*3/uL (ref 4.0–10.5)
nRBC: 0 % (ref 0.0–0.2)

## 2021-01-08 LAB — COMPREHENSIVE METABOLIC PANEL
ALT: 13 U/L (ref 0–44)
AST: 16 U/L (ref 15–41)
Albumin: 3.9 g/dL (ref 3.5–5.0)
Alkaline Phosphatase: 67 U/L (ref 38–126)
Anion gap: 7 (ref 5–15)
BUN: 16 mg/dL (ref 6–20)
CO2: 25 mmol/L (ref 22–32)
Calcium: 8.9 mg/dL (ref 8.9–10.3)
Chloride: 106 mmol/L (ref 98–111)
Creatinine, Ser: 0.91 mg/dL (ref 0.44–1.00)
GFR, Estimated: >60 mL/min (ref >60)
Glucose, Bld: 94 mg/dL (ref 70–99)
Potassium: 4 mmol/L (ref 3.5–5.1)
Sodium: 138 mmol/L (ref 135–145)
Total Bilirubin: 0.4 mg/dL (ref 0.3–1.2)
Total Protein: 6.7 g/dL (ref 6.5–8.1)

## 2021-01-08 LAB — URINALYSIS, ROUTINE W REFLEX MICROSCOPIC
Bilirubin Urine: NEGATIVE
Glucose, UA: NEGATIVE mg/dL
Ketones, ur: NEGATIVE mg/dL
Nitrite: NEGATIVE
Protein, ur: NEGATIVE mg/dL
Specific Gravity, Urine: >1.03 — ABNORMAL HIGH (ref 1.005–1.030)
pH: 5.5 (ref 5.0–8.0)

## 2021-01-08 LAB — PREGNANCY, URINE: Preg Test, Ur: NEGATIVE

## 2021-01-08 LAB — URINALYSIS, MICROSCOPIC (REFLEX)

## 2021-01-08 LAB — LIPASE, BLOOD: Lipase: 27 U/L (ref 11–51)

## 2021-01-08 MED ORDER — IOHEXOL 300 MG/ML  SOLN
100.0000 mL | Freq: Once | INTRAMUSCULAR | Status: AC | PRN
  Administered 2021-01-08: 100 mL via INTRAVENOUS

## 2021-01-08 MED ORDER — MORPHINE SULFATE (PF) 4 MG/ML IV SOLN
4.0000 mg | Freq: Once | INTRAVENOUS | Status: AC
  Administered 2021-01-08: 4 mg via INTRAVENOUS
  Filled 2021-01-08: qty 1

## 2021-01-08 NOTE — ED Notes (Signed)
D/c paperwork reviewed with pt,  pt with no questions at time of d/c. Ambulatory to ED exit.

## 2021-01-08 NOTE — ED Notes (Signed)
This RN attempted to review d/c paperwork with pt, pt reporting new abdominal pain that only started after morphine administration.  EDP Margaux made aware and to bedside.

## 2021-01-08 NOTE — ED Notes (Signed)
Lab to collect urine culture from previously collected urine

## 2021-01-08 NOTE — ED Provider Notes (Signed)
[facility] EMERGENCY DEPARTMENT Provider Note   CSN: [id number] Arrival date & time: 01/08/21  1839     History Chief Complaint  Patient presents with   Knot    Tammy Phillips is a 35 y.o. female who presents to the ED today with complaint of gradual onset, constant, worsening, knot to periumbilical region that she noticed a couple of days ago.  She states that it did not bother her significantly however over the past 2 days it is gotten worse in nature.  She states today it was the worst pain prompting ED visit.  She denies any nausea, vomiting, diarrhea, constipation.  She last had a bowel movement early this morning.  She feels like the pain is worse after she eats or if she bends down however she has not noticed any increase in size with bending or straining.  Past surgical history includes cholecystectomy.   The history is provided by the patient and medical records.      History reviewed. No pertinent past medical history.  There are no problems to display for this patient.   Past Surgical History:  Procedure Laterality Date   CHOLECYSTECTOMY       OB History   No obstetric history on file.     No family history on file.  Social History   Tobacco Use   Smoking status: Never   Smokeless tobacco: Never  Vaping Use   Vaping Use: Never used  Substance Use Topics   Alcohol use: Never   Drug use: Not Currently    Home Medications Prior to Admission medications   Medication Sig Start Date End Date Taking? Authorizing Provider  ACYCLOVIR PO Take 1 tablet by mouth daily as needed (outbreak).    [provider]  cyclobenzaprine (FLEXERIL) 10 MG tablet Take 1 tablet (10 mg total) by mouth 2 (two) times daily as needed for muscle spasms. 04/16/20   Robinson, [place] N, PA-C  HYDROcodone-acetaminophen (NORCO/VICODIN) 5-325 MG tablet Take 1-2 tablets by mouth every 4 (four) hours as needed for moderate pain. 02/12/18   Gilda Crease, MD   ibuprofen (ADVIL,MOTRIN) 800 MG tablet Take 1 tablet (800 mg total) by mouth every 6 (six) hours as needed for moderate pain. 02/12/18   Gilda Crease, MD  nabumetone (RELAFEN) 500 MG tablet Take 1 tablet (500 mg total) by mouth 2 (two) times daily. 05/26/19   Elpidio Anis, PA-C  naproxen (NAPROSYN) 500 MG tablet Take 1 tablet (500 mg total) by mouth 2 (two) times daily with a meal. 04/16/20   Robinson, [place] N, PA-C    Allergies    Citrus  Review of Systems   Review of Systems  Constitutional:  Negative for chills and fever.  Gastrointestinal:  Positive for abdominal pain. Negative for constipation, diarrhea, nausea and vomiting.  All other systems reviewed and are negative.  Physical Exam Updated Vital Signs BP 103/68   Pulse 72   Temp 98.2 F (36.8 C) (Oral)   Resp 20   Ht 5\' 6"  (1.676 m)   Wt 121.1 kg   LMP  (LMP Unknown)   SpO2 100%   BMI 43.09 kg/m   Physical Exam Vitals and nursing note reviewed.  Constitutional:      Appearance: She is obese. She is not ill-appearing or diaphoretic.  HENT:     Head: Normocephalic and atraumatic.  Eyes:     Conjunctiva/sclera: Conjunctivae normal.  Cardiovascular:     Rate and Rhythm: Normal rate and regular rhythm.  Pulses: Normal pulses.  Pulmonary:     Effort: Pulmonary effort is normal.     Breath sounds: Normal breath sounds. No wheezing, rhonchi or rales.  Abdominal:     Palpations: Abdomen is soft.     Tenderness: There is abdominal tenderness. There is no guarding or rebound.     Comments: Soft, + TTP to periumbilical region; difficult to palpate "knot" patient is describing; no obvious hernia appreciated, +BS throughout, no r/g/r,   Musculoskeletal:     Cervical back: Neck supple.  Skin:    General: Skin is warm and dry.  Neurological:     Mental Status: She is alert.    ED Results / Procedures / Treatments   Labs (all labs ordered are listed, but only abnormal results are displayed) Labs  Reviewed  URINALYSIS, ROUTINE W REFLEX MICROSCOPIC - Abnormal; Notable for the following components:      Result Value   APPearance HAZY (*)    Specific Gravity, Urine >1.030 (*)    Hgb urine dipstick SMALL (*)    Leukocytes,Ua SMALL (*)    All other components within normal limits  URINALYSIS, MICROSCOPIC (REFLEX) - Abnormal; Notable for the following components:   Bacteria, UA MANY (*)    All other components within normal limits  PREGNANCY, URINE  COMPREHENSIVE METABOLIC PANEL  CBC WITH DIFFERENTIAL/PLATELET  LIPASE, BLOOD    EKG None  Radiology CT Abdomen Pelvis W Contrast  Result Date: 01/08/2021 CLINICAL DATA:  Periumbilical abdominal pain. EXAM: CT ABDOMEN AND PELVIS WITH CONTRAST TECHNIQUE: Multidetector CT imaging of the abdomen and pelvis was performed using the standard protocol following bolus administration of intravenous contrast. CONTRAST:  OMNIPAQUE IOHEXOL 300 MG/ML  SOLN COMPARISON:  CT February 12, 2018 FINDINGS: Lower chest: No acute abnormality. Hepatobiliary: No suspicious hepatic lesion. Gallbladder surgically absent. Mild prominence of the biliary tree, favored reservoir effect post cholecystectomy. Pancreas: No pancreatic ductal dilation or evidence of acute inflammation. Spleen: Within normal limits. Adrenals/Urinary Tract: Bilateral adrenal glands are unremarkable. No hydronephrosis. No solid enhancing renal mass. Urinary bladder is unremarkable for degree of distension. Stomach/Bowel: No enteric contrast was administered. Small hiatal hernia otherwise the stomach is unremarkable. No pathologic dilation of small or large bowel. The appendix and terminal ileum appear normal. Sigmoid colonic diverticulosis without findings of acute diverticulitis. No evidence of acute bowel inflammation. Vascular/Lymphatic: No abdominal aortic aneurysm. No pathologically enlarged abdominal or pelvic lymph nodes. Reproductive: Uterus and bilateral adnexa are unremarkable. Other:  Mild fat stranding about a moderate sized fat containing supraumbilical ventral hernia with a 2.5 cm aperture with. Musculoskeletal: L5-S1 discogenic disease with Modic type endplate changes. No acute osseous abnormality. IMPRESSION: 1. Mild fat stranding about a moderate sized fat containing supraumbilical ventral hernia with a 2.5 cm aperture. Recommend clinical correlation for reducibility. 2. Scattered sigmoid colonic diverticulosis without findings of acute diverticulitis. Electronically Signed   By: Maudry Mayhew M.D.   On: 01/08/2021 21:27    Procedures Procedures   Medications Ordered in ED Medications  iohexol (OMNIPAQUE) 300 MG/ML solution 100 mL (100 mLs Intravenous Contrast Given 01/08/21 2105)  morphine 4 MG/ML injection 4 mg (4 mg Intravenous Given 01/08/21 2133)    ED Course  I have reviewed the triage vital signs and the nursing notes.  Pertinent labs & imaging results that were available during my care of the patient were reviewed by me and considered in my medical decision making (see chart for details).    MDM Rules/Calculators/A&P  35 year old female who presents to the ED today with complaint of painful knot to abdomen around periumbilical region for the past 3 to 4 days.  On arrival to the ED today vitals are stable.  Patient appears to be no acute distress.  She is noted to have tenderness palpation to the periumbilical region.  I am unable to palpate a discernible knot at this time.  No obvious hernia palpated however we will plan for lab work at this time for further evaluation.  Given the amount of pain patient is in she may require CT scan for further evaluation.  CBC without leukocytosis.  Hemoglobin stable at 13.3. CMP with out electrolyte abnormalities.  LFTs unremarkable. Lipase within normal limits at 27. UA with small leuks, 6-10 red blood cells, 6-10 white blood cells, many bacteria.  Patient denies any urinary symptoms at this time.    CT: IMPRESSION:  1. Mild fat stranding about a moderate sized fat containing  supraumbilical ventral hernia with a 2.5 cm aperture. Recommend  clinical correlation for reducibility.  2. Scattered sigmoid colonic diverticulosis without findings of  acute diverticulitis.   On reevaluation after pain medication patient reports improvement in symptoms.  Given the hernia is only fat-containing I do not feel she needs surgical consultation here in the ER.  We will plan for general surgery outpatient follow-up.  Have discussed bowel regimen with patient including MiraLAX increase fiber in diet to prevent straining/constipation.  She is recommended to take ibuprofen and Tylenol as needed for pain.  She has been instructed on strict return precautions including worsening pain, increase in size of hernia, skin changes overlying hernia, vomiting, constipation/obstipation.  Patient is in agreement with plan at this time and stable for discharge.   This note was prepared using Dragon voice recognition software and may include unintentional dictation errors due to the inherent limitations of voice recognition software.   Final Clinical Impression(s) / ED Diagnoses Final diagnoses:  Umbilical hernia without obstruction and without gangrene    Rx / DC Orders ED Discharge Orders     None        Discharge Instructions      Please follow up with [facility] for further evaluation of your hernia  I would recommend taking Ibuprofen and Tylenol as needed for pain. It is important to prevent constipation/straining on the toilet - pick up Miralax OTC and take daily. Increase the amount of water in your diet. Increase the amount of fiber in your diet. Attached is additional information on hernias.   Return to the ED IMMEDIATELY for any new/worsening symptoms including worsening pain, increase in the size of your hernia, redness/purple/black skin changes overlying the hernia, vomiting, or  inability to have a bowel movement.        Tanda Rockers, PA-C 01/08/21 2215    Sloan Leiter, DO 01/09/21 0003    Sloan Leiter, DO 01/09/21 0004

## 2021-01-08 NOTE — ED Notes (Signed)
Patient transported to CT 

## 2021-01-08 NOTE — Discharge Instructions (Signed)
Please follow up with [facility] for further evaluation of your hernia  I would recommend taking Ibuprofen and Tylenol as needed for pain. It is important to prevent constipation/straining on the toilet - pick up Miralax OTC and take daily. Increase the amount of water in your diet. Increase the amount of fiber in your diet. Attached is additional information on hernias.   Return to the ED IMMEDIATELY for any new/worsening symptoms including worsening pain, increase in the size of your hernia, redness/purple/black skin changes overlying the hernia, vomiting, or inability to have a bowel movement.

## 2021-01-08 NOTE — ED Triage Notes (Signed)
Pt c.o "knot" to mid abd first noticed 11/5-NAD-steady gait

## 2021-01-11 LAB — URINE CULTURE: Culture: >=100000 — AB

## 2021-01-12 ENCOUNTER — Telehealth: Payer: Self-pay | Admitting: Emergency Medicine

## 2021-01-12 NOTE — Telephone Encounter (Signed)
Post ED Visit - Positive Culture Follow-up  Culture report reviewed by antimicrobial stewardship pharmacist: [facility] []  , Pharm.D. []  Enzo Bi, Pharm.D., BCPS AQ-ID []  , Pharm.D., BCPS []  Celedonio Miyamoto, .D., BCPS []  [place], .D., BCPS, AAHIVP []  Georgina Pillion, Pharm.D., BCPS, AAHIVP []  [address], PharmD, BCPS []  , PharmD, BCPS []  [place], PharmD, BCPS [x]  [address], PharmD []  , PharmD, BCPS []  Estella Husk, PharmD  Pharmacy Team []  Lysle Pearl, PharmD []  , PharmD []  Phillips Climes, PharmD []  , Rph []  Agapito Games) , PharmD []  Loleta Dicker, PharmD []  , PharmD []  Mervyn Gay, PharmD []  , PharmD []  Vinnie Level, PharmD []  [facility], PharmD []  , PharmD []  Len Childs, PharmD   Positive urine culture No further patient follow-up is required at this time.  Livian Vanderbeck 01/12/2021, 2:55 PM

## 2021-08-20 ENCOUNTER — Emergency Department (HOSPITAL_BASED_OUTPATIENT_CLINIC_OR_DEPARTMENT_OTHER): Payer: Self-pay | Admitting: Radiology

## 2021-08-20 ENCOUNTER — Emergency Department (HOSPITAL_COMMUNITY)
Admission: EM | Admit: 2021-08-20 | Discharge: 2021-08-20 | Discharge status: Against Medical Advice | Payer: Medicaid Other | Attending: Emergency Medicine | Admitting: Emergency Medicine

## 2021-08-20 ENCOUNTER — Encounter (HOSPITAL_COMMUNITY): Payer: Self-pay

## 2021-08-20 ENCOUNTER — Encounter (HOSPITAL_BASED_OUTPATIENT_CLINIC_OR_DEPARTMENT_OTHER): Payer: Self-pay | Admitting: Emergency Medicine

## 2021-08-20 ENCOUNTER — Other Ambulatory Visit: Payer: Self-pay

## 2021-08-20 ENCOUNTER — Emergency Department (HOSPITAL_BASED_OUTPATIENT_CLINIC_OR_DEPARTMENT_OTHER)
Admission: EM | Admit: 2021-08-20 | Discharge: 2021-08-20 | Disposition: A | Discharge status: Home / Self Care | Payer: Self-pay | Attending: Emergency Medicine | Admitting: Emergency Medicine

## 2021-08-20 DIAGNOSIS — M62838 Other muscle spasm: Secondary | ICD-10-CM | POA: Insufficient documentation

## 2021-08-20 DIAGNOSIS — N898 Other specified noninflammatory disorders of vagina: Secondary | ICD-10-CM | POA: Insufficient documentation

## 2021-08-20 DIAGNOSIS — R21 Rash and other nonspecific skin eruption: Secondary | ICD-10-CM | POA: Insufficient documentation

## 2021-08-20 DIAGNOSIS — Z202 Contact with and (suspected) exposure to infections with a predominantly sexual mode of transmission: Secondary | ICD-10-CM | POA: Insufficient documentation

## 2021-08-20 DIAGNOSIS — M25512 Pain in left shoulder: Secondary | ICD-10-CM | POA: Insufficient documentation

## 2021-08-20 DIAGNOSIS — R509 Fever, unspecified: Secondary | ICD-10-CM | POA: Insufficient documentation

## 2021-08-20 DIAGNOSIS — B029 Zoster without complications: Secondary | ICD-10-CM | POA: Insufficient documentation

## 2021-08-20 DIAGNOSIS — Z5321 Procedure and treatment not carried out due to patient leaving prior to being seen by health care provider: Secondary | ICD-10-CM | POA: Insufficient documentation

## 2021-08-20 LAB — URINALYSIS, ROUTINE W REFLEX MICROSCOPIC
Bilirubin Urine: NEGATIVE
Bilirubin Urine: NEGATIVE
Glucose, UA: NEGATIVE mg/dL
Glucose, UA: NEGATIVE mg/dL
Ketones, ur: 5 mg/dL — AB
Ketones, ur: NEGATIVE mg/dL
Nitrite: NEGATIVE
Nitrite: NEGATIVE
Protein, ur: NEGATIVE mg/dL
Protein, ur: NEGATIVE mg/dL
Specific Gravity, Urine: 1.026 (ref 1.005–1.030)
Specific Gravity, Urine: 1.027 (ref 1.005–1.030)
pH: 5 (ref 5.0–8.0)
pH: 6 (ref 5.0–8.0)

## 2021-08-20 LAB — WET PREP, GENITAL
Clue Cells Wet Prep HPF POC: NONE SEEN
Sperm: NONE SEEN
Trich, Wet Prep: NONE SEEN
WBC, Wet Prep HPF POC: >=10 — AB (ref <10)
Yeast Wet Prep HPF POC: NONE SEEN

## 2021-08-20 LAB — PREGNANCY, URINE: Preg Test, Ur: NEGATIVE

## 2021-08-20 MED ORDER — DOXYCYCLINE HYCLATE 100 MG PO TABS
100.0000 mg | ORAL_TABLET | Freq: Once | ORAL | Status: AC
  Administered 2021-08-20: 100 mg via ORAL
  Filled 2021-08-20: qty 1

## 2021-08-20 MED ORDER — VALACYCLOVIR HCL 1 G PO TABS: 1000.0000 mg | ORAL_TABLET | Freq: Three times a day (TID) | ORAL | 0 refills | Status: AC

## 2021-08-20 MED ORDER — DOXYCYCLINE HYCLATE 100 MG PO CAPS: 100.0000 mg | ORAL_CAPSULE | Freq: Two times a day (BID) | ORAL | 0 refills | Status: AC

## 2021-08-20 MED ORDER — CEFTRIAXONE SODIUM 500 MG IJ SOLR
500.0000 mg | Freq: Once | INTRAMUSCULAR | Status: AC
  Administered 2021-08-20: 500 mg via INTRAMUSCULAR
  Filled 2021-08-20: qty 500

## 2021-08-20 MED ORDER — CYCLOBENZAPRINE HCL 10 MG PO TABS: 10.0000 mg | ORAL_TABLET | Freq: Two times a day (BID) | ORAL | 0 refills | Status: AC | PRN

## 2021-08-20 MED ORDER — STERILE WATER FOR INJECTION IJ SOLN
INTRAMUSCULAR | Status: AC
  Filled 2021-08-20: qty 10

## 2021-08-20 NOTE — ED Triage Notes (Signed)
Pt arrived via POV, c/o left shoulder pain, rash and irritation to both breast, and white vaginal discharge. Concerned for STD

## 2021-08-20 NOTE — ED Triage Notes (Signed)
Pt arrives to ED with c/o rash to bilateral breast x1 week. Pt reports she does want STD check due to vaginal discharge and lower abdominal pain for 4 days.

## 2021-08-20 NOTE — ED Provider Notes (Signed)
  [facility] EMERGENCY DEPT Provider Note   CSN: [id number] Arrival date & time: 08/20/21  1717     History {Add pertinent medical, surgical, social history, OB history to HPI:1} Chief Complaint  Patient presents with   Rash    Offie Pickron is a 36 y.o. female.   Rash      Home Medications Prior to Admission medications   Medication Sig Start Date End Date Taking? Authorizing Provider  ACYCLOVIR PO Take 1 tablet by mouth daily as needed (outbreak).    [provider]  cyclobenzaprine (FLEXERIL) 10 MG tablet Take 1 tablet (10 mg total) by mouth 2 (two) times daily as needed for muscle spasms. 04/16/20   Robinson, [place] N, PA-C  HYDROcodone-acetaminophen (NORCO/VICODIN) 5-325 MG tablet Take 1-2 tablets by mouth every 4 (four) hours as needed for moderate pain. 02/12/18   Gilda Crease, MD  ibuprofen (ADVIL,MOTRIN) 800 MG tablet Take 1 tablet (800 mg total) by mouth every 6 (six) hours as needed for moderate pain. 02/12/18   Gilda Crease, MD  nabumetone (RELAFEN) 500 MG tablet Take 1 tablet (500 mg total) by mouth 2 (two) times daily. 05/26/19   Elpidio Anis, PA-C  naproxen (NAPROSYN) 500 MG tablet Take 1 tablet (500 mg total) by mouth 2 (two) times daily with a meal. 04/16/20   Robinson, [place] N, PA-C      Allergies    Citrus    Review of Systems   Review of Systems  Skin:  Positive for rash.    Physical Exam Updated Vital Signs BP 125/70 (BP Location: Right Arm)   Pulse 80   Temp 98.3 F (36.8 C)   Resp 18   SpO2 100%  Physical Exam  ED Results / Procedures / Treatments   Labs (all labs ordered are listed, but only abnormal results are displayed) Labs Reviewed - No data to display  EKG None  Radiology No results found.  Procedures Procedures  {Document cardiac monitor, telemetry assessment procedure when appropriate:1}  Medications Ordered in ED Medications - No data to display  ED Course/ Medical Decision  Making/ A&P                           Medical Decision Making  ***  {Document critical care time when appropriate:1} {Document review of labs and clinical decision tools ie heart score, Chads2Vasc2 etc:1}  {Document your independent review of radiology images, and any outside records:1} {Document your discussion with family members, caretakers, and with consultants:1} {Document social determinants of health affecting pt's care:1} {Document your decision making why or why not admission, treatments were needed:1} Final Clinical Impression(s) / ED Diagnoses Final diagnoses:  None    Rx / DC Orders ED Discharge Orders     None

## 2021-08-20 NOTE — Discharge Instructions (Addendum)
Your history, exam, evaluation today are suggestive of vaginal discharge, sexual transmitted infection.  We treated you with multiple antibiotics so please take the doxycycline to treat this as an outpatient.  The shoulder pain was consistent with muscle spasm on exam so please use the muscle relaxant.  The rash is concerning for a small shingles outbreak.  Please take the Valtrex for this.  Please rest and stay hydrated.  If any symptoms change or worsen acutely, please return to the nearest emergency department.  If the other labs returned positive the will likely call you but please also follow-up on MyChart

## 2021-08-20 NOTE — ED Notes (Signed)
Reviewed AVS/discharge instruction with patient. Time allotted for and all questions answered. Patient is agreeable for d/c and escorted to ed exit by staff.  
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
## 2021-08-20 NOTE — ED Notes (Signed)
Lab to result urine culture from previously collected sample

## 2021-08-22 LAB — HIV ANTIBODY (ROUTINE TESTING W REFLEX): HIV Screen 4th Generation wRfx: NONREACTIVE

## 2021-08-22 LAB — GC/CHLAMYDIA PROBE AMP (~~LOC~~) NOT AT ARMC
Chlamydia: NEGATIVE
Comment: NEGATIVE
Comment: NORMAL
Neisseria Gonorrhea: NEGATIVE

## 2021-08-23 LAB — URINE CULTURE: Culture: >=100000 — AB

## 2021-08-24 ENCOUNTER — Telehealth (HOSPITAL_BASED_OUTPATIENT_CLINIC_OR_DEPARTMENT_OTHER): Payer: Self-pay | Admitting: *Deleted

## 2021-08-24 NOTE — Telephone Encounter (Signed)
Post ED Visit - Positive Culture Follow-up  Culture report reviewed by antimicrobial stewardship pharmacist: [facility] []  Enzo Bi, Pharm.D. []  Celedonio Miyamoto, Pharm.D., BCPS AQ-ID []  Garvin Fila, Pharm.D., BCPS []  Georgina Pillion, [address].D., BCPS []  [place], [address].D., BCPS, AAHIVP []  Estella Husk, Pharm.D., BCPS, AAHIVP []  Lysle Pearl, PharmD, BCPS []  Phillips Climes, PharmD, BCPS []  Agapito Games, PharmD, BCPS []  Verlan Friends, PharmD []  Mervyn Gay, PharmD, BCPS [x]  Daylene Posey, PharmD  [facility] []  Len Childs, PharmD []  Greer Pickerel, PharmD []  Adalberto Cole, PharmD []  Perlie Gold, Rph []  Lonell Face) Jean Rosenthal, PharmD []  Earl Many, PharmD []  Junita Push, PharmD []  Dorna Leitz, PharmD []  Terrilee Files, PharmD []  Lynann Beaver, PharmD []  Keturah Barre, PharmD []  Loralee Pacas, PharmD []  Bernadene Person, PharmD   Positive urine culture Treated with Doxycycline, no further patient follow-up is required at this time per Langston Masker, PA  Tammy Phillips 08/24/2021, 11:42 AM
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
